# Patient Record
Sex: Male | Born: 1955 | Race: Asian | Hispanic: No | Smoking: Former smoker
Health system: Southern US, Community
[De-identification: ages and names within clinical notes are randomized; demographics above are authoritative.]

## PROBLEM LIST (undated history)

## (undated) DIAGNOSIS — R29898 Other symptoms and signs involving the musculoskeletal system: Secondary | ICD-10-CM

## (undated) HISTORY — DX: Other symptoms and signs involving the musculoskeletal system: R29.898

---

## 2010-02-10 ENCOUNTER — Emergency Department (HOSPITAL_COMMUNITY): Admission: EM | Admit: 2010-02-10 | Discharge: 2010-02-10 | Payer: Self-pay | Admitting: Emergency Medicine

## 2011-01-04 LAB — POCT I-STAT, CHEM 8
Calcium, Ion: 1.16 mmol/L (ref 1.12–1.32)
Chloride: 101 mEq/L (ref 96–112)
TCO2: 30 mmol/L (ref 0–100)

## 2015-08-25 ENCOUNTER — Other Ambulatory Visit: Payer: Self-pay | Admitting: Internal Medicine

## 2015-08-25 DIAGNOSIS — R29898 Other symptoms and signs involving the musculoskeletal system: Secondary | ICD-10-CM

## 2015-08-31 ENCOUNTER — Ambulatory Visit
Admission: RE | Admit: 2015-08-31 | Discharge: 2015-08-31 | Disposition: A | Discharge status: Home / Self Care | Payer: BLUE CROSS/BLUE SHIELD | Source: Ambulatory Visit | Attending: Internal Medicine | Admitting: Internal Medicine

## 2015-08-31 DIAGNOSIS — R29898 Other symptoms and signs involving the musculoskeletal system: Secondary | ICD-10-CM

## 2015-09-17 ENCOUNTER — Encounter: Payer: Self-pay | Admitting: Internal Medicine

## 2015-09-30 ENCOUNTER — Encounter: Payer: Self-pay | Admitting: Neurology

## 2015-09-30 ENCOUNTER — Ambulatory Visit (INDEPENDENT_AMBULATORY_CARE_PROVIDER_SITE_OTHER): Payer: BLUE CROSS/BLUE SHIELD | Admitting: Neurology

## 2015-09-30 VITALS — BP 144/86 | HR 79 | Ht 62.0 in | Wt 119.0 lb

## 2015-09-30 DIAGNOSIS — R29898 Other symptoms and signs involving the musculoskeletal system: Secondary | ICD-10-CM

## 2015-09-30 HISTORY — DX: Other symptoms and signs involving the musculoskeletal system: R29.898

## 2015-09-30 NOTE — Progress Notes (Signed)
Reason for visit: Arm weakness  Referring physician: Dr. Lucia Bitterisovec  Dennis Dunlap is a 59 y.o. male  History of present illness:  Dennis Dunlap is a 59 year old right-handed [place] male with a history of some right arm weakness since March 2016. The patient has had ongoing difficulty with weakness of the right arm with crawling sensations going down the left arm and both legs. The patient does not believe that there is any weakness of the left arm or the legs, he denies any balance issues or difficulty controlling the bowels or the bladder. He has not had any speech or swallowing problems, he denies a neck pain or headaches. The patient was seen by Dr. Wylene Simmerisovec, and a CT scan of the cervical spine was done. This suggested a central disc herniation at C3-4 level with possible spinal cord impingement. The patient is sent to this office for further evaluation.  Past Medical History  Diagnosis Date  . Right arm weakness 09/30/2015    History reviewed. No pertinent past surgical history.  History reviewed. No pertinent family history.  Social history:  reports that he quit smoking about 4 years ago. He has never used smokeless tobacco. He reports that he drinks about 1.2 - 2.4 oz of alcohol per week. He reports that he does not use illicit drugs.  Medications:  Prior to Admission medications   Not on File     No Known Allergies  ROS:  Out of a complete 14 system review of symptoms, the patient complains only of the following symptoms, and all other reviewed systems are negative.  Joint pain Numbness, weakness  Blood pressure 144/86, pulse 79, height 5\' 2"  (1.575 m), weight 119 lb (53.978 kg).  Physical Exam  General: The patient is alert and cooperative at the time of the examination.  Eyes: Pupils are equal, round, and reactive to light. Discs are flat bilaterally.  Neck: The neck is supple, no carotid bruits are noted.  Respiratory: The respiratory examination is  clear.  Cardiovascular: The cardiovascular examination reveals a regular rate and rhythm, no obvious murmurs or rubs are noted.  Skin: Extremities are without significant edema.  Neurologic Exam  Mental status: The patient is alert and oriented x 3 at the time of the examination. The patient has apparent normal recent and remote memory, with an apparently normal attention span and concentration ability.  Cranial nerves: Facial symmetry is present. There is good sensation of the face to pinprick and soft touch bilaterally. The strength of the facial muscles and the muscles to head turning and shoulder shrug are normal bilaterally. Speech is well enunciated, no aphasia or dysarthria is noted. Extraocular movements are full. Visual fields are full. The tongue is midline, and the patient has symmetric elevation of the soft palate. No obvious hearing deficits are noted.  Motor: The motor testing reveals 5 over 5 strength of all 4 extremities, with exception of 4/5 strength of the right biceps and with external rotation of the right arm. Good symmetric motor tone is noted throughout.  Sensory: Sensory testing is intact to pinprick, soft touch, vibration sensation, and position sense on all 4 extremities. No evidence of extinction is noted.  Coordination: Cerebellar testing reveals good finger-nose-finger and heel-to-shin bilaterally.  Gait and station: Gait is normal. Tandem gait is normal. Romberg is negative. No drift is seen.  Reflexes: Deep tendon reflexes are symmetric and normal bilaterally. Toes are downgoing bilaterally.   CT cervical 08/31/2015:  IMPRESSION: C3-4: Left facet arthropathy with  2 mm of anterolisthesis. Central disc herniation. Central canal stenosis with probable cord deformity. Foraminal stenosis left worse than right that could be significant.  C4-5: Facet arthropathy on the right with 1 mm anterolisthesis. Bulging of the disc. Mild canal stenosis. Foraminal  stenosis on the right that could be significant.  C5-6: Retrolisthesis of 2 mm. Mild central canal stenosis. Foraminal stenosis bilaterally that could be significant.  C6-7: Mild central canal stenosis. Bilateral foraminal stenosis that could be significant.  * CT scan images were reviewed online. I agree with the written report.    Assessment/Plan:  1. Right arm weakness  2. Possible spinal cord compression at the C3-4 level  The patient does have some weakness of the right arm, and some sensory changes involving the left arm and both legs. He denies any alteration of sensation with neck flexion and extension. The CT of the cervical spine suggests possible spinal cord compression. We will confirm this on MRI, if there is evidence of cord impingement, a surgical referral will be required. If not, EMG and nerve conduction study will be done to exclude a radiculopathy or an anterior horn cell disease process.  Marlan Palau MD 09/30/2015 6:39 PM  [facility] [address] [place], [place] [address]  Phone [phone] Fax [phone]

## 2016-06-02 ENCOUNTER — Encounter (HOSPITAL_COMMUNITY): Payer: Self-pay | Admitting: Emergency Medicine

## 2016-06-02 ENCOUNTER — Ambulatory Visit (HOSPITAL_COMMUNITY)
Admission: EM | Admit: 2016-06-02 | Discharge: 2016-06-02 | Disposition: A | Discharge status: Home / Self Care | Payer: BLUE CROSS/BLUE SHIELD | Attending: Family Medicine | Admitting: Family Medicine

## 2016-06-02 DIAGNOSIS — M4802 Spinal stenosis, cervical region: Secondary | ICD-10-CM

## 2016-06-02 DIAGNOSIS — R202 Paresthesia of skin: Secondary | ICD-10-CM | POA: Diagnosis not present

## 2016-06-02 NOTE — ED Provider Notes (Signed)
CSN: [id number]     Arrival date & time 06/02/16  1602 History   First MD Initiated Contact with Patient 06/02/16 1701     Chief Complaint  Patient presents with  . Numbness   (Consider location/radiation/quality/duration/timing/severity/associated sxs/prior Treatment) [id number] male patient is accompanied by a friend who speaks Rhade and is translating for him.  He presents with bilateral numbness and tingling from his shoulders that radiates down to his hands. It is now affecting his performance at his job. He denies any pain, change in bladder or bowel habits, dizziness or headaches. This has been occurring since Nov 2016. [facility] and had a CT scan that showed some cervical stenosis from C4 to C7 and possible disc herniation around C3/4. They had recommended an MRI and further evaluation. He never had the MRI and did not understand the treatment plan due to language barrier. He does not want to return to that practice and prefers to be referred today to a different Neurology group.        Past Medical History:  Diagnosis Date  . Right arm weakness 09/30/2015   History reviewed. No pertinent surgical history. History reviewed. No pertinent family history. Social History  Substance Use Topics  . Smoking status: Former Smoker    Quit date: 10/17/2010  . Smokeless tobacco: Never Used  . Alcohol use 1.2 - 2.4 oz/week    2 - 4 Cans of beer per week    Review of Systems  Constitutional: Negative for fatigue and fever.  Musculoskeletal: Negative for arthralgias, joint swelling and neck pain.  Neurological: Positive for weakness and numbness. Negative for dizziness, seizures and headaches.    Allergies  Review of patient's allergies indicates no known allergies.  Home Medications   Prior to Admission medications   Not on File   Meds Ordered and Administered this Visit  Medications - No data to display  BP 132/69 (BP  Location: Left Arm)   Pulse 72   Temp 98.3 F (36.8 C) (Oral)   Resp 18   SpO2 100%  No data found.   Physical Exam  Constitutional: He is oriented to person, place, and time. He appears well-developed and well-nourished. No distress.  HENT:  Head: Normocephalic and atraumatic.  Mouth/Throat: Uvula is midline, oropharynx is clear and moist and mucous membranes are normal.  Eyes: Conjunctivae, EOM and lids are normal. Pupils are equal, round, and reactive to light.  Neck: Trachea normal and normal range of motion. Neck supple. No muscular tenderness present. No edema and normal range of motion present.  Cardiovascular: Normal rate and regular rhythm.   Musculoskeletal: Normal range of motion.  No decreased range of motion, tenderness or change in sensation noted bilaterally from shoulder to fingers.   Neurological: He is alert and oriented to person, place, and time. He has normal strength and normal reflexes. No cranial nerve deficit or sensory deficit.  There is no sensory deficits noted in arms or hands. Sensation is normal. Muscle strength is normal. Pulses are normal bilaterally. Good capillary refill bilaterally.   Skin: Skin is warm and dry. Capillary refill takes less than 2 seconds.  Psychiatric: He has a normal mood and affect. His behavior is normal.    Urgent Care Course   Clinical Course    Procedures (including critical care time)  Labs Review Labs Reviewed - No data to display  Imaging Review No results found.   Visual Acuity Review  Right Eye Distance:   Left Eye Distance:   Bilateral Distance:    Right Eye Near:   Left Eye Near:    Bilateral Near:         MDM   1. Cervical spinal stenosis   2. Paresthesias    Reviewed previous CT scan in Dec 2016 which showed C3-4 central disc herniation and stenosis from C3 to C7. Discussed results with patient and interpreter. Recommend referral back to neurologist for further evaluation. Recommend patient  see [facility] or [facility]- numbers provided.     Sudie GrumblingAnn Berry Donyelle Enyeart, NP 06/03/16 [phone]

## 2016-06-02 NOTE — ED Triage Notes (Signed)
Via male friend who speaks Rhade  Pt c/o bilateral numbness/tingly of shoulders that radiates down to arms and hands onset 9 months  Does not have a PCP... Also reports no past medical hx  A&O x4... NAD

## 2016-06-02 NOTE — Discharge Instructions (Signed)
You will be referred to a Neurologist for further evaluation.

## 2016-06-07 ENCOUNTER — Ambulatory Visit: Payer: BLUE CROSS/BLUE SHIELD

## 2016-06-09 ENCOUNTER — Ambulatory Visit: Payer: BLUE CROSS/BLUE SHIELD

## 2016-06-11 ENCOUNTER — Ambulatory Visit (INDEPENDENT_AMBULATORY_CARE_PROVIDER_SITE_OTHER): Payer: BLUE CROSS/BLUE SHIELD | Admitting: Physician Assistant

## 2016-06-11 VITALS — BP 132/80 | HR 77 | Temp 97.5°F | Resp 16 | Ht 62.0 in | Wt 119.1 lb

## 2016-06-11 DIAGNOSIS — Z131 Encounter for screening for diabetes mellitus: Secondary | ICD-10-CM

## 2016-06-11 DIAGNOSIS — Z23 Encounter for immunization: Secondary | ICD-10-CM

## 2016-06-11 DIAGNOSIS — Z1329 Encounter for screening for other suspected endocrine disorder: Secondary | ICD-10-CM

## 2016-06-11 DIAGNOSIS — Z114 Encounter for screening for human immunodeficiency virus [HIV]: Secondary | ICD-10-CM

## 2016-06-11 DIAGNOSIS — Z0000 Encounter for general adult medical examination without abnormal findings: Secondary | ICD-10-CM

## 2016-06-11 DIAGNOSIS — Z1389 Encounter for screening for other disorder: Secondary | ICD-10-CM

## 2016-06-11 DIAGNOSIS — Z7189 Other specified counseling: Secondary | ICD-10-CM

## 2016-06-11 DIAGNOSIS — Z130 Encounter for screening for diseases of the blood and blood-forming organs and certain disorders involving the immune mechanism: Secondary | ICD-10-CM | POA: Diagnosis not present

## 2016-06-11 DIAGNOSIS — Z13220 Encounter for screening for lipoid disorders: Secondary | ICD-10-CM

## 2016-06-11 DIAGNOSIS — Z1159 Encounter for screening for other viral diseases: Secondary | ICD-10-CM | POA: Diagnosis not present

## 2016-06-11 LAB — COMPLETE METABOLIC PANEL WITH GFR
ALBUMIN: 4.7 g/dL (ref 3.6–5.1)
ALK PHOS: 61 U/L (ref 40–115)
ALT: 16 U/L (ref 9–46)
AST: 22 U/L (ref 10–35)
BUN: 15 mg/dL (ref 7–25)
CO2: 25 mmol/L (ref 20–31)
Calcium: 8.9 mg/dL (ref 8.6–10.3)
Chloride: 103 mmol/L (ref 98–110)
Creat: 0.78 mg/dL (ref 0.70–1.33)
GFR, Est African American: >89 mL/min (ref >=60)
GFR, Est Non African American: >89 mL/min (ref >=60)
GLUCOSE: 103 mg/dL — AB (ref 65–99)
POTASSIUM: 3.8 mmol/L (ref 3.5–5.3)
SODIUM: 138 mmol/L (ref 135–146)
TOTAL PROTEIN: 7.6 g/dL (ref 6.1–8.1)
Total Bilirubin: 1 mg/dL (ref 0.2–1.2)

## 2016-06-11 LAB — CBC
HCT: 42.5 % (ref 38.5–50.0)
HEMOGLOBIN: 14.8 g/dL (ref 13.2–17.1)
MCH: 28.2 pg (ref 27.0–33.0)
MCHC: 34.8 g/dL (ref 32.0–36.0)
MCV: 81.1 fL (ref 80.0–100.0)
MPV: 9.4 fL (ref 7.5–12.5)
Platelets: 278 10*3/uL (ref 140–400)
RBC: 5.24 MIL/uL (ref 4.20–5.80)
RDW: 13.7 % (ref 11.0–15.0)
WBC: 4.7 10*3/uL (ref 3.8–10.8)

## 2016-06-11 LAB — HEPATITIS C ANTIBODY: HCV Ab: NEGATIVE

## 2016-06-11 LAB — LIPID PANEL
CHOL/HDL RATIO: 2.9 ratio (ref <=5.0)
Cholesterol: 205 mg/dL — ABNORMAL HIGH (ref 125–200)
HDL: 71 mg/dL (ref >=40)
LDL CALC: 120 mg/dL (ref <130)
TRIGLYCERIDES: 70 mg/dL (ref <150)
VLDL: 14 mg/dL (ref <30)

## 2016-06-11 LAB — HIV ANTIBODY (ROUTINE TESTING W REFLEX): HIV: NONREACTIVE

## 2016-06-11 LAB — HEPATITIS B SURFACE ANTIGEN: Hepatitis B Surface Ag: NEGATIVE

## 2016-06-11 LAB — HEPATITIS B SURFACE ANTIBODY, QUANTITATIVE: Hepatitis B-Post: 10.9 m[IU]/mL

## 2016-06-11 LAB — TSH: TSH: 0.47 m[IU]/L (ref 0.40–4.50)

## 2016-06-11 NOTE — Patient Instructions (Signed)
     IF you received an x-ray today, you will receive an invoice from [facility]. Please contact [facility] at [phone] with questions or concerns regarding your invoice.   IF you received labwork today, you will receive an invoice from [facility]. Please contact [facility] at [phone] with questions or concerns regarding your invoice.   Our billing staff will not be able to assist you with questions regarding bills from these companies.  You will be contacted with the lab results as soon as they are available. The fastest way to get your results is to activate your My Chart account. Instructions are located on the last page of this paperwork. If you have not heard from us regarding the results in 2 weeks, please contact this office.      

## 2016-06-11 NOTE — Progress Notes (Signed)
06/11/2016 12:51 PM   DOB: 10/22/1955 / MRN: [id number]  SUBJECTIVE:  Dennis Dunlap is a 60 y.o. male presenting for an annual exam.  He drinks two normal sized beers daily. He has smoked for 6 years at about 1 pack per day.  He quit five days ago.  He works in Cabin crew.  He denies any problems with his health aside from some shoulder problems.  He does not want a colonoscopy.  He receives flu vaccines at work.  He has lived in the [place] since 1988 and is originally from [place].    Immunization History  Administered Date(s) Administered  . Tdap 06/11/2016     He has No Known Allergies.   He  has a past medical history of Right arm weakness (09/30/2015).    He  reports that he quit smoking about 5 years ago. He has never used smokeless tobacco. He reports that he drinks about 1.2 - 2.4 oz of alcohol per week . He reports that he does not use drugs. He  has no sexual activity history on file. The patient  has no past surgical history on file.  His family history is not on file.`  Review of Systems  Constitutional: Negative for weight loss.  Respiratory: Negative for cough and shortness of breath.   Cardiovascular: Negative for chest pain.  Musculoskeletal: Negative for myalgias.  Neurological: Negative for dizziness.  Psychiatric/Behavioral: Negative for depression. The patient does not have insomnia.     The problem list and medications were reviewed and updated by myself where necessary and exist elsewhere in the encounter.   OBJECTIVE:  BP (!) 144/68   Pulse 77   Temp 97.5 F (36.4 C) (Oral)   Resp 16   Ht 5\' 2"  (1.575 m)   Wt 119 lb 2 oz (54 kg)   SpO2 98%   BMI 21.79 kg/m   Physical Exam  Constitutional: He is oriented to person, place, and time.  HENT:  Head: Normocephalic.  Nose: Nose normal.  Mouth/Throat: Oropharynx is clear and moist. No oropharyngeal exudate.  Neck: No thyromegaly present.  Cardiovascular: Normal rate, regular rhythm and normal  heart sounds.  Exam reveals no gallop and no friction rub.   No murmur heard. Pulmonary/Chest: Effort normal and breath sounds normal.  Abdominal: Soft. Bowel sounds are normal.  Musculoskeletal: Normal range of motion. He exhibits no edema.  Neurological: He is alert and oriented to person, place, and time. He has normal reflexes. He displays normal reflexes. No cranial nerve deficit. He exhibits normal muscle tone. Coordination normal.  Skin: Skin is warm and dry.  Psychiatric: He has a normal mood and affect.    No results found for this or any previous visit (from the past 72 hour(s)).  No results found.  ASSESSMENT AND PLAN  [name] was seen today for establish care.  Diagnoses and all orders for this visit:  Annual physical exam: 60 year old well appearing thin male here for an annual physical. He is behind on care.  Will screen heavily. He refuses the colonoscopy. I have advised he give this some thought for next year   Screening for thyroid disorder -     TSH  Screening, lipid -     Lipid panel  Screening for HIV (human immunodeficiency virus) -     HIV antibody  Need for hepatitis C screening test -     Hepatitis C antibody  Need for hepatitis B screening test -  Hepatitis B surface antigen -     Hepatitis B surface antibody  Screening for nephropathy -     COMPLETE METABOLIC PANEL WITH GFR  Screening for deficiency anemia -     CBC  Diabetes mellitus screening -     Hemoglobin A1c  Need for Tdap vaccination -     Tdap vaccine greater than or equal to 7yo IM    The patient is advised to call or return to clinic if he does not see an improvement in symptoms, or to seek the care of the closest emergency department if he worsens with the above plan.   Deliah BostonMichael Ashe Graybeal, MHS, PA-C [facility] 06/11/2016 12:51 PM

## 2016-06-12 LAB — HEMOGLOBIN A1C
Hgb A1c MFr Bld: 5.3 % (ref <5.7)
MEAN PLASMA GLUCOSE: 105 mg/dL

## 2016-06-23 ENCOUNTER — Ambulatory Visit (INDEPENDENT_AMBULATORY_CARE_PROVIDER_SITE_OTHER): Payer: BLUE CROSS/BLUE SHIELD | Admitting: Neurology

## 2016-06-23 ENCOUNTER — Encounter: Payer: Self-pay | Admitting: Neurology

## 2016-06-23 VITALS — BP 140/72 | HR 72 | Ht 62.0 in | Wt 121.0 lb

## 2016-06-23 DIAGNOSIS — R29898 Other symptoms and signs involving the musculoskeletal system: Secondary | ICD-10-CM

## 2016-06-23 MED ORDER — GABAPENTIN 100 MG PO CAPS: 100.0000 mg | ORAL_CAPSULE | Freq: Three times a day (TID) | ORAL | 3 refills | Status: AC

## 2016-06-23 NOTE — Progress Notes (Signed)
Reason for visit: Arm weakness  Dennis Dunlap is an 60 y.o. male  History of present illness:  Mr. Dennis Dunlap is a 60 year old right-handed Asian male with a history of right arm weakness. The patient was seen here previously on 09/30/2015. The patient had a prior CT scan of the cervical spine that showed evidence of a central disc herniation at the C3-4 level with probable cord deformation. The patient had neuroforaminal stenosis on the right side at the C4-5 level. The patient has developed gradual worsening of weakness of the right arm, he now has developed weakness of the left arm. He has developed some pain in the left shoulder greater than the right shoulder and some numbness and paresthesias down the right greater than left arm. The patient denies any issues with the legs, he denies balance problems or difficulty controlling the bowels or the bladder. The patient denies any neck discomfort or headaches. He was set up for MRI evaluation of the cervical spine in December 2016, but the patient never had this study done.  Past Medical History:  Diagnosis Date  . Right arm weakness 09/30/2015    History reviewed. No pertinent surgical history.  History reviewed. No pertinent family history.  Social history:  reports that he quit smoking about 5 years ago. He has never used smokeless tobacco. He reports that he drinks about 1.2 - 2.4 oz of alcohol per week . He reports that he does not use drugs.   No Known Allergies  Medications:  Prior to Admission medications   Not on File    ROS:  Out of a complete 14 system review of symptoms, the patient complains only of the following symptoms, and all other reviewed systems are negative.  Joint swelling, aching muscles, muscle cramps Numbness  Blood pressure 140/72, pulse 72, height 5\' 2"  (1.575 m), weight 121 lb (54.9 kg).  Physical Exam  General: The patient is alert and cooperative at the time of the examination.  Skin: No significant  peripheral edema is noted.   Neurologic Exam  Mental status: The patient is alert and oriented x 3 at the time of the examination. The patient has apparent normal recent and remote memory, with an apparently normal attention span and concentration ability.   Cranial nerves: Facial symmetry is present. Speech is normal, no aphasia or dysarthria is noted. Extraocular movements are full. Visual fields are full.  Motor: The patient has good strength in all 4 extremities, with exception of 3/5 strength of the right deltoid and biceps muscle, 4/5 strength of the left deltoid muscle, weakness laterally with external rotation of the arms, right greater than left  Sensory examination: Soft touch sensation is symmetric on the face, arms, and legs.  Coordination: The patient has good finger-nose-finger and heel-to-shin bilaterally.  Gait and station: The patient has a normal gait. Tandem gait is normal. Romberg is negative. No drift is seen.  Reflexes: Deep tendon reflexes are symmetric.   Assessment/Plan:  1. Cervical spondylosis, spinal stenosis  2. Bilateral arm weakness  The patient has now developed worsened right arm weakness and new onset left arm weakness. The patient does not wish to have MRI evaluation of the cervical spine, he does not wish to consider surgery. He wishes to go on a medication for his discomfort, he will be started on low-dose gabapentin taking 100 mg 3 times daily. He will follow-up in 4 months. If the patient does desire further evaluation, he is to contact our office.  C.  Lesia Sago MD 06/23/2016 11:28 AM  [facility] [address] [place], [place] [address]  Phone [phone] Fax [phone]

## 2016-10-25 ENCOUNTER — Ambulatory Visit: Payer: BLUE CROSS/BLUE SHIELD | Admitting: Adult Health

## 2016-10-26 ENCOUNTER — Encounter: Payer: Self-pay | Admitting: Adult Health

## 2017-02-16 IMAGING — CT CT CERVICAL SPINE W/O CM
2 series · 10 of 14 positions shown, 12 images · non-contrast
Comparison: None.

CLINICAL DATA: Bilateral arm weakness, numbness and tingling since
[REDACTED] of this year.

EXAM:
CT CERVICAL SPINE WITHOUT CONTRAST
TECHNIQUE: Multidetector CT imaging of the cervical spine was performed without
intravenous contrast. Multiplanar CT image reconstructions were also
generated.

[Series 3: cspine soft · axial · 0.29mm/px · z∈[-156,-40]mm · 5 of 88 slices shown]
[im 15/88  soft-tissue]
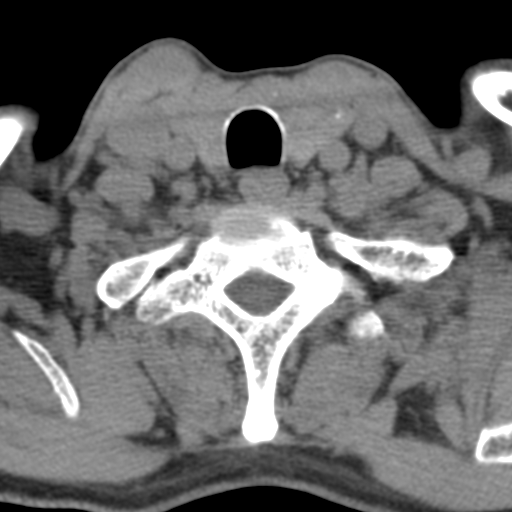
[im 30/88  soft-tissue]
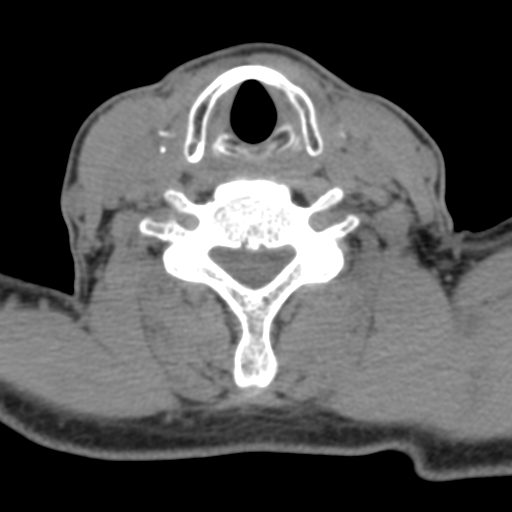
[im 44/88  soft-tissue]
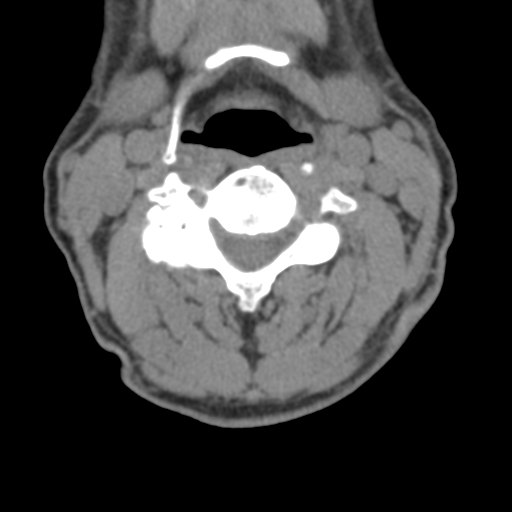
[im 59/88  soft-tissue]
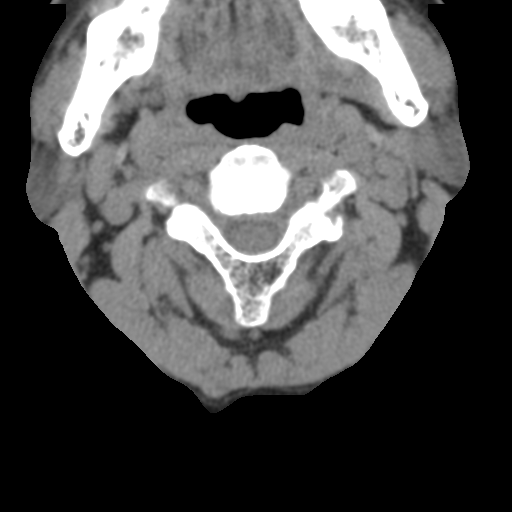
[im 73/88  soft-tissue]
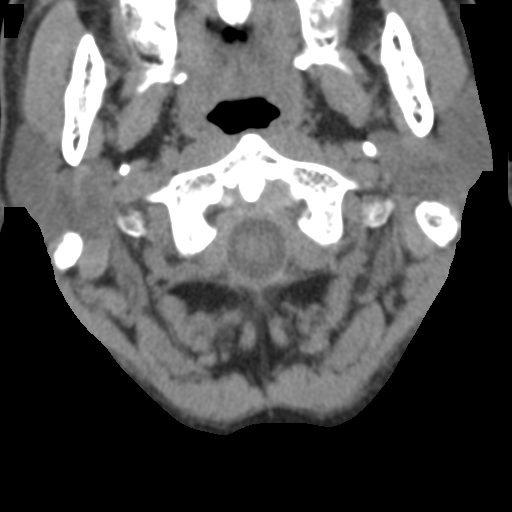

[Series 9: angled axial · axial · 0.26mm/px · z∈[-166,-50]mm · 5 of 89 slices shown, 7 images]
[im 15/89  soft-tissue]
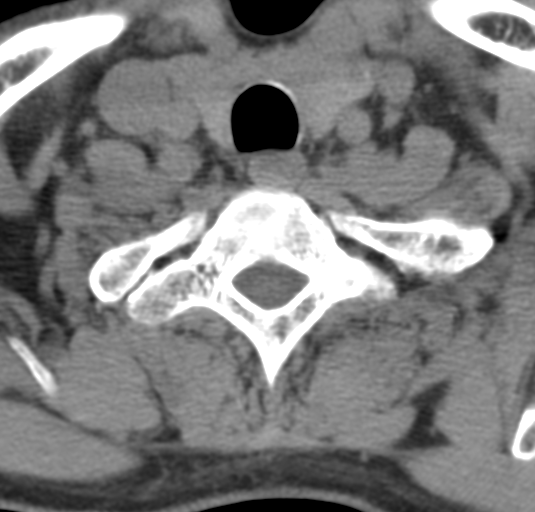
[im 15/89  bone]
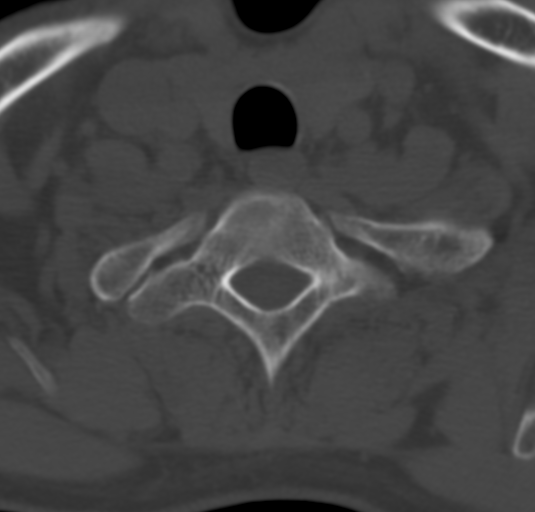
[im 30/89  bone]
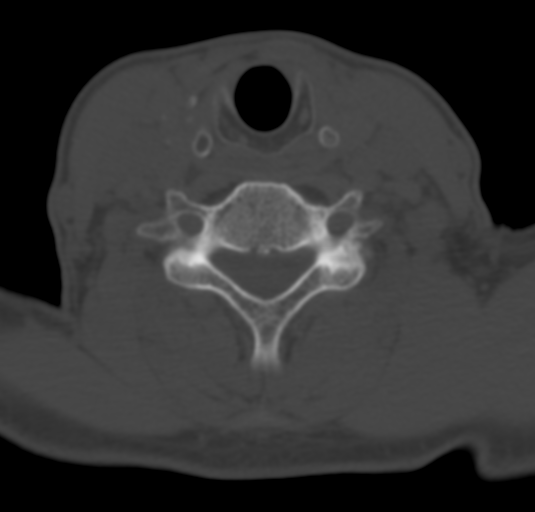
[im 45/89  bone]
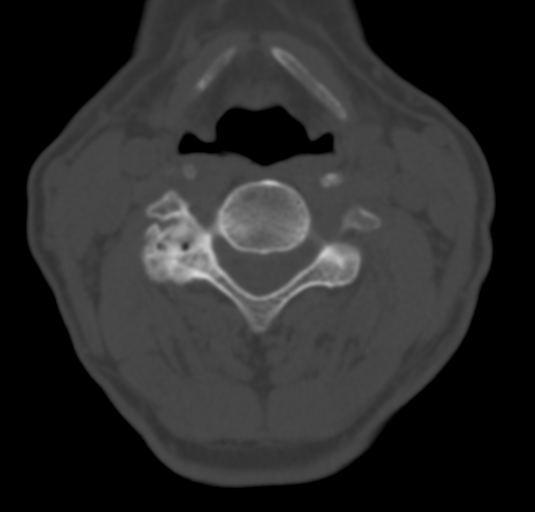
[im 59/89  bone]
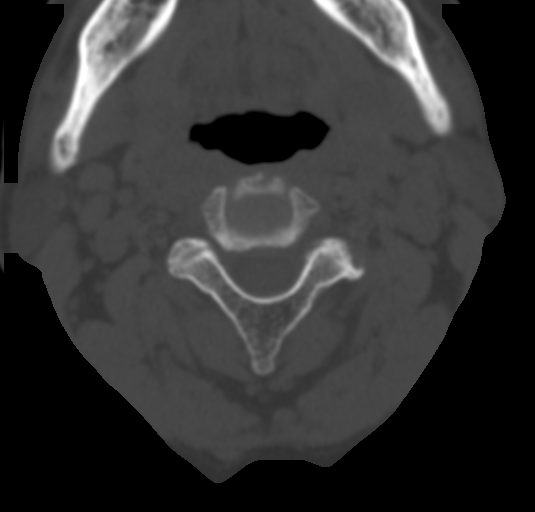
[im 74/89  soft-tissue]
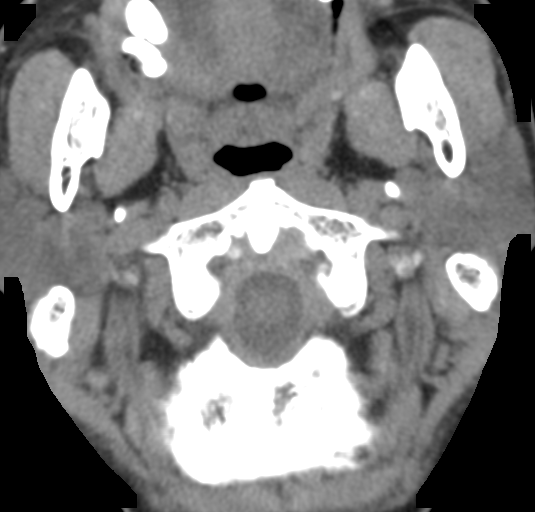
[im 74/89  bone]
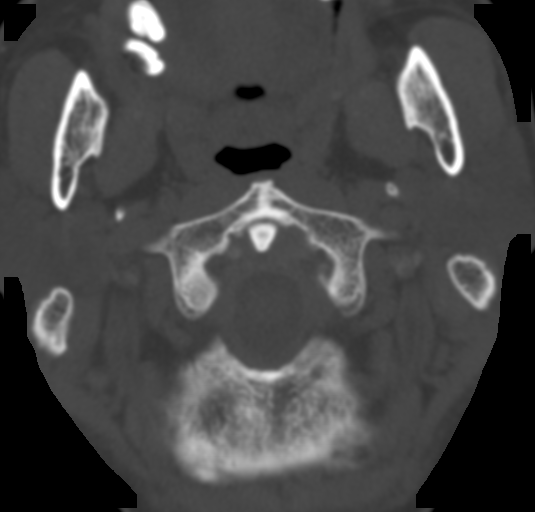

[10 of 14 positions shown; findings below may reference images not displayed]

FINDINGS: The foramen magnum is widely patent. There is ordinary
osteoarthritis at the C1-2 articulation but no encroachment upon the
neural spaces.

C2-3: There is facet degeneration on the left. Mild foraminal
narrowing on the left. No central canal stenosis.

C3-4: There is facet arthropathy on the left with 1 or 2 mm of
anterolisthesis. There is a disc herniation and encroaches upon the
spinal canal, possibly deforming the cord. Mild foraminal stenosis
bilaterally because of osteophytic encroachment.

C4-5: There is advanced facet arthropathy on the right.
Anterolisthesis of 1 mm. Mild bulging of the disc. Mild central
canal narrowing without visible compression of the cord. Foraminal
stenosis on the right because of osteophytic encroachment.

C5-6: Retrolisthesis of 2 mm. Endplate osteophytes and bulging of
the disc. Mild central canal stenosis. Mild to moderate bilateral
foraminal stenosis.

C6-7: Spondylosis with endplate osteophytes and bulging of the disc.
Mild central canal stenosis. Bilateral foraminal stenosis that could
be significant.

C7-T1:  Normal interspace.
IMPRESSION: C3-4: Left facet arthropathy with 2 mm of anterolisthesis. Central
disc herniation. Central canal stenosis with probable cord
deformity. Foraminal stenosis left worse than right that could be
significant.

C4-5: Facet arthropathy on the right with 1 mm anterolisthesis.
Bulging of the disc. Mild canal stenosis. Foraminal stenosis on the
right that could be significant.

C5-6: Retrolisthesis of 2 mm. Mild central canal stenosis. Foraminal
stenosis bilaterally that could be significant.

C6-7: Mild central canal stenosis. Bilateral foraminal stenosis that
could be significant.

## 2024-04-21 ENCOUNTER — Encounter (HOSPITAL_COMMUNITY): Payer: Self-pay | Admitting: *Deleted

## 2024-04-21 ENCOUNTER — Emergency Department (HOSPITAL_COMMUNITY)
Admission: EM | Admit: 2024-04-21 | Discharge: 2024-04-22 | Disposition: A | Discharge status: Home / Self Care | Attending: Emergency Medicine | Admitting: Emergency Medicine

## 2024-04-21 ENCOUNTER — Other Ambulatory Visit: Payer: Self-pay

## 2024-04-21 DIAGNOSIS — M5030 Other cervical disc degeneration, unspecified cervical region: Secondary | ICD-10-CM | POA: Diagnosis not present

## 2024-04-21 DIAGNOSIS — M436 Torticollis: Secondary | ICD-10-CM | POA: Insufficient documentation

## 2024-04-21 NOTE — ED Triage Notes (Signed)
 The pt is c/o neck pain today he has been sleeping on the sofa no known injury

## 2024-04-22 ENCOUNTER — Emergency Department (HOSPITAL_COMMUNITY)

## 2024-04-22 MED ORDER — NAPROXEN 250 MG PO TABS
500.0000 mg | ORAL_TABLET | Freq: Once | ORAL | Status: AC
  Administered 2024-04-22: 500 mg via ORAL
  Filled 2024-04-22: qty 2

## 2024-04-22 MED ORDER — PREDNISONE 20 MG PO TABS: ORAL_TABLET | ORAL | 0 refills | Status: AC

## 2024-04-22 MED ORDER — DEXAMETHASONE SODIUM PHOSPHATE 4 MG/ML IJ SOLN
4.0000 mg | Freq: Once | INTRAMUSCULAR | Status: AC
  Administered 2024-04-22: 4 mg via INTRAMUSCULAR
  Filled 2024-04-22: qty 1

## 2024-04-22 MED ORDER — LIDOCAINE 5 % EX PTCH
2.0000 | MEDICATED_PATCH | CUTANEOUS | Status: DC
  Administered 2024-04-22: 2 via TRANSDERMAL
  Filled 2024-04-22: qty 2

## 2024-04-22 MED ORDER — ACETAMINOPHEN 500 MG PO TABS
1000.0000 mg | ORAL_TABLET | Freq: Once | ORAL | Status: AC
  Administered 2024-04-22: 1000 mg via ORAL
  Filled 2024-04-22: qty 2

## 2024-04-22 MED ORDER — LIDOCAINE 5 % EX PTCH: 1.0000 | MEDICATED_PATCH | CUTANEOUS | 0 refills | Status: AC

## 2024-04-22 NOTE — ED Provider Notes (Signed)
 [place] [facility] AT [facility] Provider Note   CSN: [id number] Arrival date & time: 04/21/24  2259     Patient presents with: Torticollis   Dennis Dunlap is a 68 y.o. male.   The history is provided by the patient and a relative. The history is limited by a language barrier. A language interpreter was used.  Neck Injury This is a new problem. The current episode started 12 to 24 hours ago (awoke with stiffness side to side). The problem occurs constantly. The problem has not changed since onset.Pertinent negatives include no chest pain, no abdominal pain, no headaches and no shortness of breath. Nothing aggravates the symptoms. Nothing relieves the symptoms. He has tried nothing for the symptoms. The treatment provided no relief.      Past Medical History:  Diagnosis Date   Right arm weakness 09/30/2015     Prior to Admission medications   Medication Sig Start Date End Date Taking? Authorizing Provider  lidocaine  (LIDODERM ) 5 % Place 1 patch onto the skin daily. Remove & Discard patch within 12 hours or as directed by MD 04/22/24  Yes Ailany Koren, MD  predniSONE  (DELTASONE ) 20 MG tablet 3 tabs po day one, then 2 po daily x 4 days 04/22/24  Yes Damiyah Ditmars, MD  gabapentin  (NEURONTIN ) 100 MG capsule Take 1 capsule (100 mg total) by mouth 3 (three) times daily. 06/23/16   Jenel Carlin POUR, MD    Allergies: Patient has no known allergies.    Review of Systems  Respiratory:  Negative for shortness of breath.   Cardiovascular:  Negative for chest pain.  Gastrointestinal:  Negative for abdominal pain.  Musculoskeletal:  Positive for neck pain.  Neurological:  Negative for weakness, numbness and headaches.  All other systems reviewed and are negative.   Updated Vital Signs BP 114/68   Pulse 62   Temp 98.2 F (36.8 C)   Resp 16   Ht 5' 2 (1.575 m)   Wt 54.9 kg   SpO2 99%   BMI 22.14 kg/m   Physical Exam Vitals and nursing note reviewed.   Constitutional:      General: He is not in acute distress.    Appearance: He is well-developed. He is not diaphoretic.  HENT:     Head: Normocephalic and atraumatic.     Nose: Nose normal.  Eyes:     Conjunctiva/sclera: Conjunctivae normal.     Pupils: Pupils are equal, round, and reactive to light.  Neck:     Vascular: No carotid bruit.  Cardiovascular:     Rate and Rhythm: Normal rate and regular rhythm.     Pulses: Normal pulses.     Heart sounds: Normal heart sounds.  Pulmonary:     Effort: Pulmonary effort is normal.     Breath sounds: Normal breath sounds. No wheezing or rales.  Abdominal:     General: Bowel sounds are normal.     Palpations: Abdomen is soft.     Tenderness: There is no abdominal tenderness. There is no guarding or rebound.  Musculoskeletal:        General: Normal range of motion.     Cervical back: Normal range of motion and neck supple. No rigidity.  Lymphadenopathy:     Cervical: No cervical adenopathy.  Skin:    General: Skin is warm and dry.     Capillary Refill: Capillary refill takes less than 2 seconds.  Neurological:     General: No focal deficit present.  Mental Status: He is alert and oriented to person, place, and time.     Deep Tendon Reflexes: Reflexes normal.  Psychiatric:        Mood and Affect: Mood normal.        Behavior: Behavior normal.     (all labs ordered are listed, but only abnormal results are displayed) Labs Reviewed - No data to display  EKG: None  Radiology: CT Cervical Spine Wo Contrast Result Date: 04/22/2024 CLINICAL DATA:  Polytrauma, blunt EXAM: CT CERVICAL SPINE WITHOUT CONTRAST TECHNIQUE: Multidetector CT imaging of the cervical spine was performed without intravenous contrast. Multiplanar CT image reconstructions were also generated. RADIATION DOSE REDUCTION: This exam was performed according to the departmental dose-optimization program which includes automated exposure control, adjustment of the mA  and/or kV according to patient size and/or use of iterative reconstruction technique. COMPARISON:  08/31/2015 FINDINGS: Alignment: Slight anterolisthesis of C3 on C4 and C4 on C5. Slight retrolisthesis of C5 on C6. Findings are similar to prior study. Skull base and vertebrae: No acute fracture. No primary bone lesion or focal pathologic process. Soft tissues and spinal canal: No prevertebral fluid or swelling. No visible canal hematoma. Disc levels: Advanced bilateral degenerative facet disease causing multilevel bilateral neural foraminal narrowing. No focal disc herniation. Upper chest: No acute findings. Other: None IMPRESSION: Degenerative disc and facet disease.  No acute bony abnormality. Electronically Signed   By: Franky Crease M.D.   On: 04/22/2024 02:18   CT Head Wo Contrast Result Date: 04/22/2024 CLINICAL DATA:  Polytrauma, blunt EXAM: CT HEAD WITHOUT CONTRAST TECHNIQUE: Contiguous axial images were obtained from the base of the skull through the vertex without intravenous contrast. RADIATION DOSE REDUCTION: This exam was performed according to the departmental dose-optimization program which includes automated exposure control, adjustment of the mA and/or kV according to patient size and/or use of iterative reconstruction technique. COMPARISON:  None Available. FINDINGS: Brain: No acute intracranial abnormality. Specifically, no hemorrhage, hydrocephalus, mass lesion, acute infarction, or significant intracranial injury. Vascular: No hyperdense vessel or unexpected calcification. Skull: No acute calvarial abnormality. Sinuses/Orbits: No acute findings Other: None IMPRESSION: No acute intracranial abnormality. Electronically Signed   By: Franky Crease M.D.   On: 04/22/2024 02:16     Procedures   Medications Ordered in the ED  lidocaine  (LIDODERM ) 5 % 2 patch (2 patches Transdermal Patch Applied 04/22/24 0130)  naproxen  (NAPROSYN ) tablet 500 mg (500 mg Oral Given 04/22/24 0130)  acetaminophen   (TYLENOL ) tablet 1,000 mg (1,000 mg Oral Given 04/22/24 0130)  dexamethasone  (DECADRON ) injection 4 mg (4 mg Intramuscular Given 04/22/24 0226)                                    Medical Decision Making Patient with stiffness with movement since awakening   Amount and/or Complexity of Data Reviewed External Data Reviewed: notes.    Details: Previous notes reviewed  Radiology: ordered and independent interpretation performed.    Details: Negative head CT  Risk OTC drugs. Prescription drug management. Risk Details: Well appearing.  This is consistent with torticollis.  Will start steroids and have patient follow up with PMD.       Final diagnoses:  Torticollis, acute  DDD (degenerative disc disease), cervical   No signs of systemic illness or infection. The patient is nontoxic-appearing on exam and vital signs are within normal limits.  I have reviewed the triage vital signs and the nursing  notes. Pertinent labs & imaging results that were available during my care of the patient were reviewed by me and considered in my medical decision making (see chart for details). After history, exam, and medical workup I feel the patient has been appropriately medically screened and is safe for discharge home. Pertinent diagnoses were discussed with the patient. Patient was given return precautions.    ED Discharge Orders          Ordered    lidocaine  (LIDODERM ) 5 %  Every 24 hours        04/22/24 0333    predniSONE  (DELTASONE ) 20 MG tablet        04/22/24 0333               Patte Winkel, MD 04/22/24 [phone]
